# Patient Record
Sex: Male | Born: 1990 | Race: Black or African American | Hispanic: No | Marital: Single | Smoking: Current every day smoker
Health system: Southern US, Community
[De-identification: ages and names within clinical notes are randomized; demographics above are authoritative.]

## PROBLEM LIST (undated history)

## (undated) HISTORY — PX: SPINAL FUSION: SHX223

---
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
## 2011-04-12 ENCOUNTER — Ambulatory Visit
Payer: BC Managed Care – PPO | Attending: Physical Medicine and Rehabilitation | Admitting: Rehabilitative and Restorative Service Providers"

## 2011-04-12 DIAGNOSIS — M6281 Muscle weakness (generalized): Secondary | ICD-10-CM | POA: Insufficient documentation

## 2011-04-12 DIAGNOSIS — IMO0001 Reserved for inherently not codable concepts without codable children: Secondary | ICD-10-CM | POA: Insufficient documentation

## 2011-04-12 DIAGNOSIS — M25569 Pain in unspecified knee: Secondary | ICD-10-CM | POA: Insufficient documentation

## 2011-04-22 ENCOUNTER — Ambulatory Visit
Payer: BC Managed Care – PPO | Attending: Physical Medicine and Rehabilitation | Admitting: Rehabilitative and Restorative Service Providers"

## 2011-04-22 DIAGNOSIS — M25569 Pain in unspecified knee: Secondary | ICD-10-CM | POA: Insufficient documentation

## 2011-04-22 DIAGNOSIS — M6281 Muscle weakness (generalized): Secondary | ICD-10-CM | POA: Insufficient documentation

## 2011-04-22 DIAGNOSIS — IMO0001 Reserved for inherently not codable concepts without codable children: Secondary | ICD-10-CM | POA: Insufficient documentation

## 2011-04-24 ENCOUNTER — Ambulatory Visit: Payer: BC Managed Care – PPO | Admitting: Rehabilitative and Restorative Service Providers"

## 2011-04-29 ENCOUNTER — Ambulatory Visit: Payer: BC Managed Care – PPO | Admitting: Rehabilitative and Restorative Service Providers"

## 2011-05-02 ENCOUNTER — Ambulatory Visit: Payer: BC Managed Care – PPO | Admitting: Rehabilitative and Restorative Service Providers"

## 2011-05-06 ENCOUNTER — Ambulatory Visit: Payer: BC Managed Care – PPO | Admitting: Rehabilitative and Restorative Service Providers"

## 2011-05-08 ENCOUNTER — Encounter: Payer: BC Managed Care – PPO | Admitting: Rehabilitative and Restorative Service Providers"

## 2011-05-17 ENCOUNTER — Ambulatory Visit: Payer: BC Managed Care – PPO | Admitting: Physical Therapy

## 2011-05-20 ENCOUNTER — Encounter: Payer: BC Managed Care – PPO | Admitting: Rehabilitative and Restorative Service Providers"

## 2011-05-21 ENCOUNTER — Ambulatory Visit
Payer: BC Managed Care – PPO | Attending: Physical Medicine and Rehabilitation | Admitting: Rehabilitative and Restorative Service Providers"

## 2011-05-21 DIAGNOSIS — M6281 Muscle weakness (generalized): Secondary | ICD-10-CM | POA: Insufficient documentation

## 2011-05-21 DIAGNOSIS — IMO0001 Reserved for inherently not codable concepts without codable children: Secondary | ICD-10-CM | POA: Insufficient documentation

## 2011-05-21 DIAGNOSIS — M25569 Pain in unspecified knee: Secondary | ICD-10-CM | POA: Insufficient documentation

## 2011-05-24 ENCOUNTER — Encounter: Payer: BC Managed Care – PPO | Admitting: Rehabilitative and Restorative Service Providers"

## 2011-05-27 ENCOUNTER — Encounter: Payer: BC Managed Care – PPO | Admitting: Rehabilitative and Restorative Service Providers"

## 2011-05-29 ENCOUNTER — Ambulatory Visit: Payer: BC Managed Care – PPO | Admitting: Rehabilitative and Restorative Service Providers"

## 2011-06-03 ENCOUNTER — Encounter: Payer: BC Managed Care – PPO | Admitting: Rehabilitative and Restorative Service Providers"

## 2011-06-05 ENCOUNTER — Encounter: Payer: BC Managed Care – PPO | Admitting: Rehabilitative and Restorative Service Providers"

## 2011-06-12 ENCOUNTER — Ambulatory Visit: Payer: BC Managed Care – PPO | Admitting: Rehabilitative and Restorative Service Providers"

## 2011-06-14 ENCOUNTER — Encounter: Payer: BC Managed Care – PPO | Admitting: Rehabilitative and Restorative Service Providers"

## 2016-04-18 ENCOUNTER — Emergency Department: Payer: No Typology Code available for payment source

## 2016-04-18 ENCOUNTER — Emergency Department
Admission: EM | Admit: 2016-04-18 | Discharge: 2016-04-18 | Disposition: A | Discharge status: Home / Self Care | Payer: No Typology Code available for payment source | Attending: Emergency Medicine | Admitting: Emergency Medicine

## 2016-04-18 DIAGNOSIS — S161XXA Strain of muscle, fascia and tendon at neck level, initial encounter: Secondary | ICD-10-CM | POA: Insufficient documentation

## 2016-04-18 DIAGNOSIS — S199XXA Unspecified injury of neck, initial encounter: Secondary | ICD-10-CM | POA: Diagnosis present

## 2016-04-18 DIAGNOSIS — S7011XA Contusion of right thigh, initial encounter: Secondary | ICD-10-CM | POA: Insufficient documentation

## 2016-04-18 DIAGNOSIS — Y999 Unspecified external cause status: Secondary | ICD-10-CM | POA: Diagnosis not present

## 2016-04-18 DIAGNOSIS — S7001XA Contusion of right hip, initial encounter: Secondary | ICD-10-CM | POA: Diagnosis not present

## 2016-04-18 DIAGNOSIS — Y92410 Unspecified street and highway as the place of occurrence of the external cause: Secondary | ICD-10-CM | POA: Diagnosis not present

## 2016-04-18 DIAGNOSIS — Y9389 Activity, other specified: Secondary | ICD-10-CM | POA: Insufficient documentation

## 2016-04-18 MED ORDER — IBUPROFEN 800 MG PO TABS: 800.0000 mg | ORAL_TABLET | Freq: Three times a day (TID) | ORAL | Status: AC | PRN

## 2016-04-18 MED ORDER — CYCLOBENZAPRINE HCL 10 MG PO TABS: 10.0000 mg | ORAL_TABLET | Freq: Three times a day (TID) | ORAL | Status: AC | PRN
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
## 2016-04-18 NOTE — ED Notes (Signed)
Pt triaged on paper due to computer issues.

## 2016-04-18 NOTE — ED Notes (Signed)
See paper triage 
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
## 2016-12-29 ENCOUNTER — Emergency Department (HOSPITAL_COMMUNITY): Payer: Self-pay

## 2016-12-29 ENCOUNTER — Encounter (HOSPITAL_COMMUNITY): Payer: Self-pay

## 2016-12-29 ENCOUNTER — Emergency Department (HOSPITAL_COMMUNITY)
Admission: EM | Admit: 2016-12-29 | Discharge: 2016-12-29 | Disposition: A | Discharge status: Home / Self Care | Payer: Self-pay | Attending: Emergency Medicine | Admitting: Emergency Medicine

## 2016-12-29 DIAGNOSIS — W228XXA Striking against or struck by other objects, initial encounter: Secondary | ICD-10-CM | POA: Insufficient documentation

## 2016-12-29 DIAGNOSIS — K625 Hemorrhage of anus and rectum: Secondary | ICD-10-CM | POA: Insufficient documentation

## 2016-12-29 DIAGNOSIS — Z79899 Other long term (current) drug therapy: Secondary | ICD-10-CM | POA: Insufficient documentation

## 2016-12-29 DIAGNOSIS — T148XXA Other injury of unspecified body region, initial encounter: Secondary | ICD-10-CM

## 2016-12-29 DIAGNOSIS — R935 Abnormal findings on diagnostic imaging of other abdominal regions, including retroperitoneum: Secondary | ICD-10-CM | POA: Insufficient documentation

## 2016-12-29 DIAGNOSIS — S40812A Abrasion of left upper arm, initial encounter: Secondary | ICD-10-CM | POA: Insufficient documentation

## 2016-12-29 DIAGNOSIS — Y999 Unspecified external cause status: Secondary | ICD-10-CM | POA: Insufficient documentation

## 2016-12-29 DIAGNOSIS — Z23 Encounter for immunization: Secondary | ICD-10-CM | POA: Insufficient documentation

## 2016-12-29 DIAGNOSIS — Y939 Activity, unspecified: Secondary | ICD-10-CM | POA: Insufficient documentation

## 2016-12-29 DIAGNOSIS — R079 Chest pain, unspecified: Secondary | ICD-10-CM | POA: Insufficient documentation

## 2016-12-29 DIAGNOSIS — Y929 Unspecified place or not applicable: Secondary | ICD-10-CM | POA: Insufficient documentation

## 2016-12-29 DIAGNOSIS — F17200 Nicotine dependence, unspecified, uncomplicated: Secondary | ICD-10-CM | POA: Insufficient documentation

## 2016-12-29 DIAGNOSIS — R930 Abnormal findings on diagnostic imaging of skull and head, not elsewhere classified: Secondary | ICD-10-CM | POA: Insufficient documentation

## 2016-12-29 LAB — URINALYSIS, ROUTINE W REFLEX MICROSCOPIC
Bilirubin Urine: NEGATIVE
Glucose, UA: NEGATIVE mg/dL
Ketones, ur: 20 mg/dL — AB
Nitrite: NEGATIVE
PROTEIN: NEGATIVE mg/dL
Specific Gravity, Urine: 1.013 (ref 1.005–1.030)
pH: 5 (ref 5.0–8.0)

## 2016-12-29 LAB — COMPREHENSIVE METABOLIC PANEL
ALT: 27 U/L (ref 17–63)
AST: 33 U/L (ref 15–41)
Albumin: 4.9 g/dL (ref 3.5–5.0)
Alkaline Phosphatase: 56 U/L (ref 38–126)
Anion gap: 16 — ABNORMAL HIGH (ref 5–15)
BUN: 14 mg/dL (ref 6–20)
CHLORIDE: 104 mmol/L (ref 101–111)
CO2: 21 mmol/L — AB (ref 22–32)
CREATININE: 1.39 mg/dL — AB (ref 0.61–1.24)
Calcium: 9.1 mg/dL (ref 8.9–10.3)
GFR calc Af Amer: >60 mL/min (ref >60)
Glucose, Bld: 111 mg/dL — ABNORMAL HIGH (ref 65–99)
Potassium: 3.6 mmol/L (ref 3.5–5.1)
Sodium: 141 mmol/L (ref 135–145)
Total Bilirubin: 0.9 mg/dL (ref 0.3–1.2)
Total Protein: 8.2 g/dL — ABNORMAL HIGH (ref 6.5–8.1)

## 2016-12-29 LAB — I-STAT CHEM 8, ED
BUN: 13 mg/dL (ref 6–20)
CHLORIDE: 109 mmol/L (ref 101–111)
CREATININE: 1.4 mg/dL — AB (ref 0.61–1.24)
Calcium, Ion: 1.04 mmol/L — ABNORMAL LOW (ref 1.15–1.40)
Glucose, Bld: 105 mg/dL — ABNORMAL HIGH (ref 65–99)
HEMATOCRIT: 47 % (ref 39.0–52.0)
HEMOGLOBIN: 16 g/dL (ref 13.0–17.0)
POTASSIUM: 3.8 mmol/L (ref 3.5–5.1)
Sodium: 144 mmol/L (ref 135–145)
TCO2: 19 mmol/L (ref 0–100)

## 2016-12-29 LAB — CBC
HCT: 46.9 % (ref 39.0–52.0)
Hemoglobin: 16.3 g/dL (ref 13.0–17.0)
MCH: 31.4 pg (ref 26.0–34.0)
MCHC: 34.8 g/dL (ref 30.0–36.0)
MCV: 90.4 fL (ref 78.0–100.0)
PLATELETS: 351 10*3/uL (ref 150–400)
RBC: 5.19 MIL/uL (ref 4.22–5.81)
RDW: 13.4 % (ref 11.5–15.5)
WBC: 22 10*3/uL — AB (ref 4.0–10.5)

## 2016-12-29 LAB — POC OCCULT BLOOD, ED: Fecal Occult Bld: POSITIVE — AB

## 2016-12-29 LAB — I-STAT CG4 LACTIC ACID, ED: LACTIC ACID, VENOUS: 3.65 mmol/L — AB (ref 0.5–1.9)

## 2016-12-29 LAB — SAMPLE TO BLOOD BANK

## 2016-12-29 LAB — PROTIME-INR
INR: 0.95
PROTHROMBIN TIME: 12.6 s (ref 11.4–15.2)

## 2016-12-29 LAB — ETHANOL: Alcohol, Ethyl (B): 241 mg/dL — ABNORMAL HIGH (ref <5)

## 2016-12-29 MED ORDER — SODIUM CHLORIDE 0.9 % IV BOLUS (SEPSIS)
1000.0000 mL | Freq: Once | INTRAVENOUS | Status: AC
  Administered 2016-12-29: 1000 mL via INTRAVENOUS

## 2016-12-29 MED ORDER — CEFAZOLIN IN D5W 1 GM/50ML IV SOLN
1.0000 g | Freq: Once | INTRAVENOUS | Status: AC
  Administered 2016-12-29: 1 g via INTRAVENOUS
  Filled 2016-12-29: qty 50

## 2016-12-29 MED ORDER — SODIUM CHLORIDE 0.9 % IV BOLUS (SEPSIS)
2000.0000 mL | Freq: Once | INTRAVENOUS | Status: AC
  Administered 2016-12-29: 2000 mL via INTRAVENOUS

## 2016-12-29 MED ORDER — IOPAMIDOL (ISOVUE-300) INJECTION 61%
100.0000 mL | Freq: Once | INTRAVENOUS | Status: AC | PRN
  Administered 2016-12-29: 100 mL via INTRAVENOUS

## 2016-12-29 MED ORDER — IOPAMIDOL (ISOVUE-300) INJECTION 61%
INTRAVENOUS | Status: AC
  Filled 2016-12-29: qty 100

## 2016-12-29 MED ORDER — KETOROLAC TROMETHAMINE 30 MG/ML IJ SOLN
30.0000 mg | Freq: Once | INTRAMUSCULAR | Status: AC
  Administered 2016-12-29: 30 mg via INTRAVENOUS
  Filled 2016-12-29: qty 1

## 2016-12-29 MED ORDER — TETANUS-DIPHTH-ACELL PERTUSSIS 5-2.5-18.5 LF-MCG/0.5 IM SUSP
0.5000 mL | Freq: Once | INTRAMUSCULAR | Status: AC
  Administered 2016-12-29: 0.5 mL via INTRAMUSCULAR
  Filled 2016-12-29: qty 0.5

## 2016-12-29 MED ORDER — OMEPRAZOLE 20 MG PO CPDR: 20.0000 mg | DELAYED_RELEASE_CAPSULE | Freq: Every day | ORAL | 0 refills | Status: AC

## 2016-12-29 MED ORDER — MELOXICAM 7.5 MG PO TABS: 7.5000 mg | ORAL_TABLET | Freq: Every day | ORAL | 0 refills | Status: AC

## 2016-12-29 NOTE — ED Notes (Signed)
Shown labs to Dr Palumbo. 
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
## 2017-08-28 IMAGING — CT CT CERVICAL SPINE W/O CM
4 of 8 series · 13 of 33 positions shown, 14 images · non-contrast
Comparison: Radiograph 04/18/2016

CLINICAL DATA: Trauma

EXAM:
CT HEAD WITHOUT CONTRAST
CT CERVICAL SPINE WITHOUT CONTRAST
TECHNIQUE: Multidetector CT imaging of the head and cervical spine was
performed following the standard protocol without intravenous
contrast. Multiplanar CT image reconstructions of the cervical spine
were also generated.

[Series 6: c-spine st · axial · 0.23mm/px · z∈[-294,-202]mm · 3 of 93 slices shown, 4 images]
[im 24/93  soft-tissue]
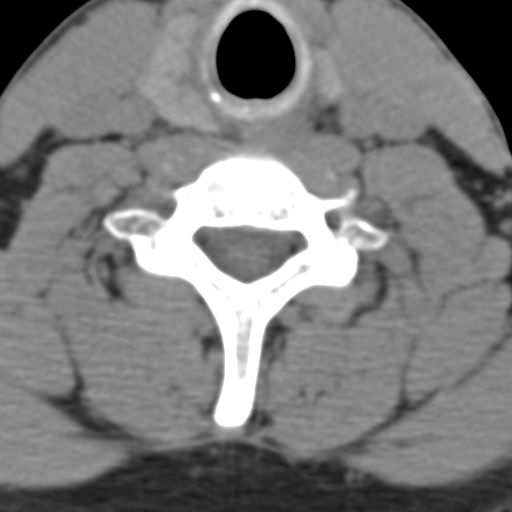
[im 24/93  bone]
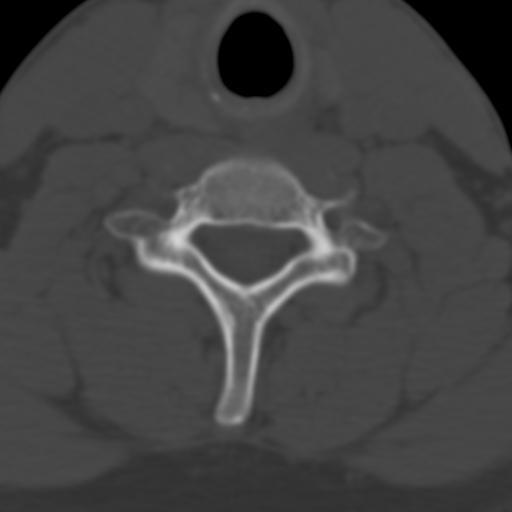
[im 47/93  bone]
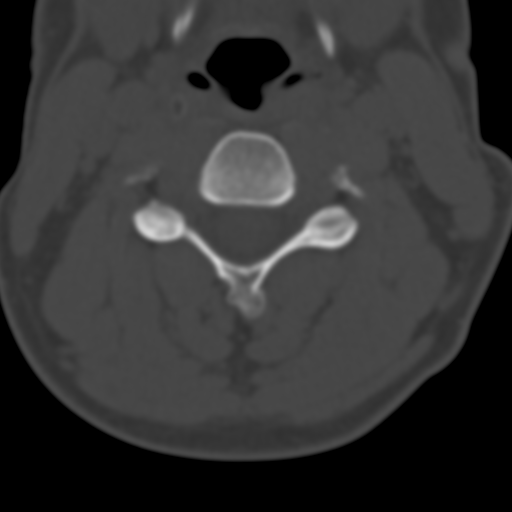
[im 70/93  bone]
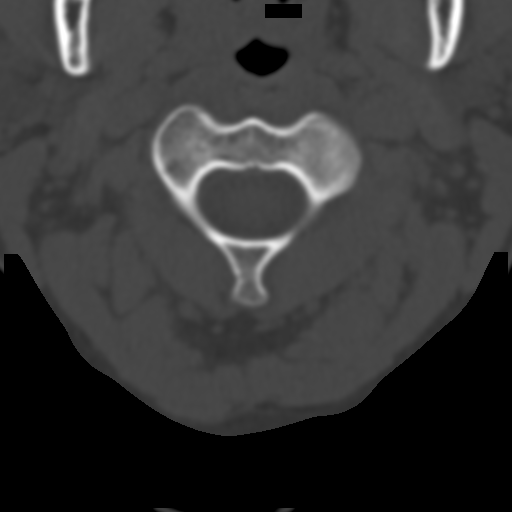

[Series 9: coronal · coronal · 0.30mm/px · 2 of 68 slices shown]
[im 23/68  bone]
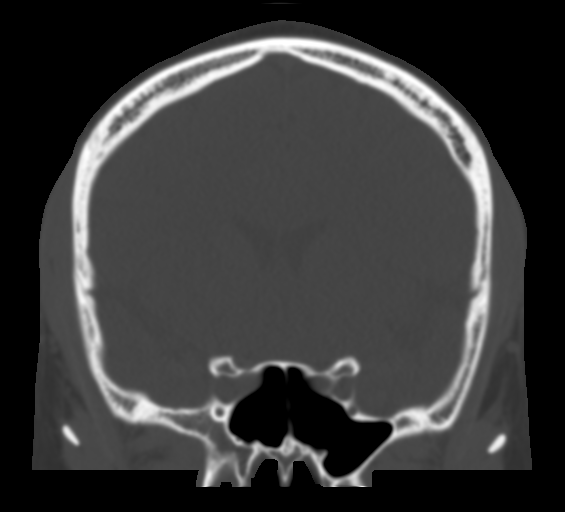
[im 45/68  bone]
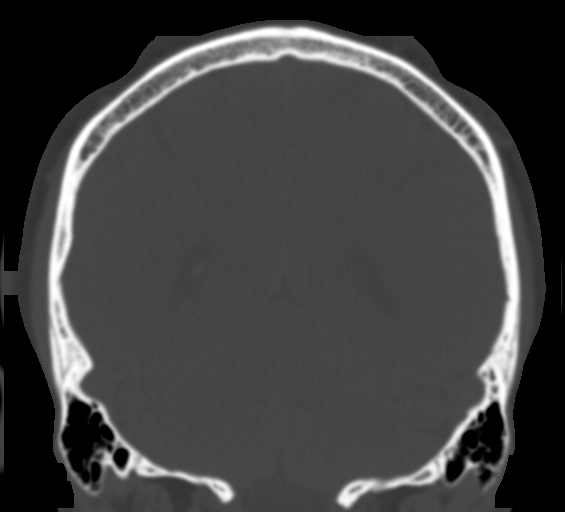

[Series 10: sagittal · sagittal · 0.30mm/px · 5 of 54 slices shown]
[im 8/54  bone]
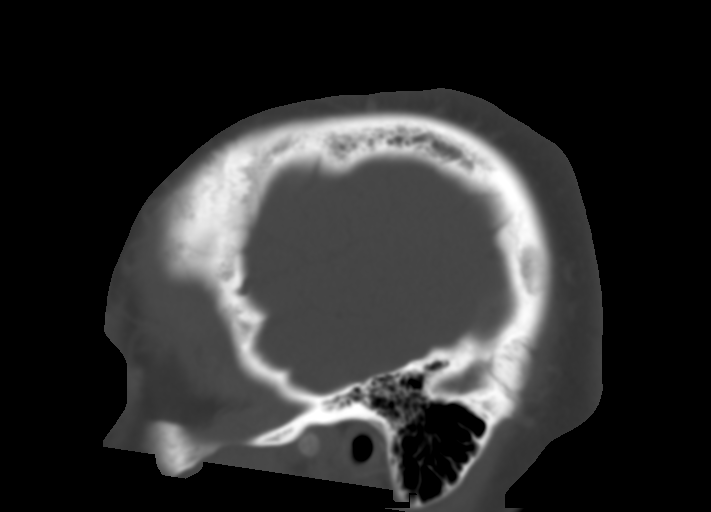
[im 16/54  bone]
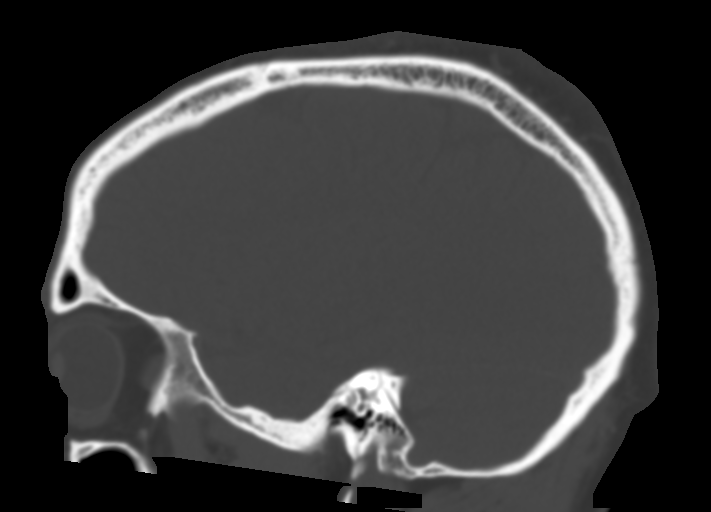
[im 23/54  bone]
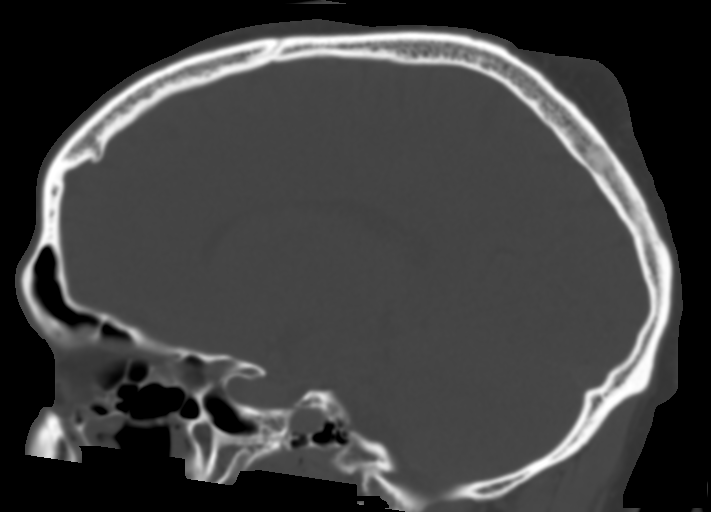
[im 31/54  bone]
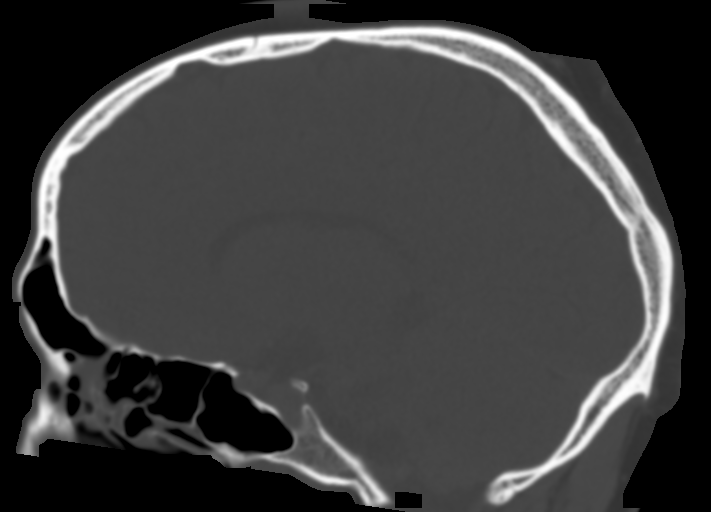
[im 38/54  bone]
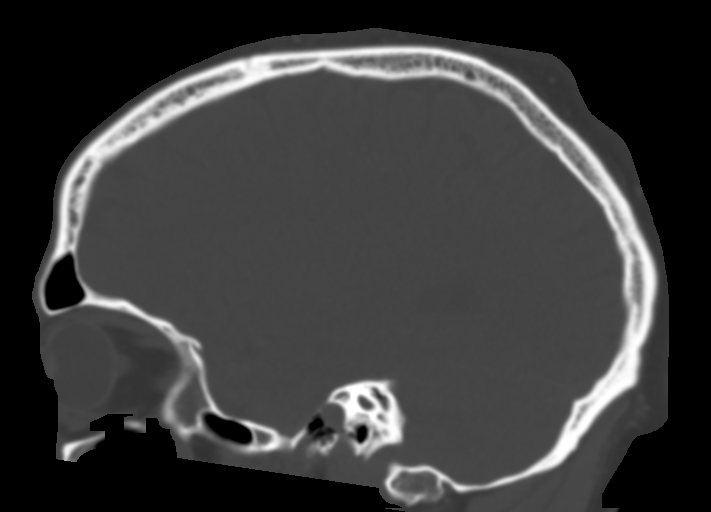

[Series 11: axial recon · axial · 0.22mm/px · z∈[-325,-226]mm · 3 of 105 slices shown]
[im 27/105  bone]
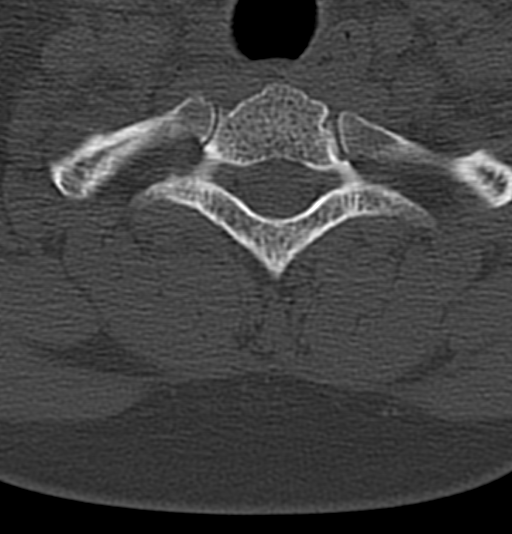
[im 53/105  bone]
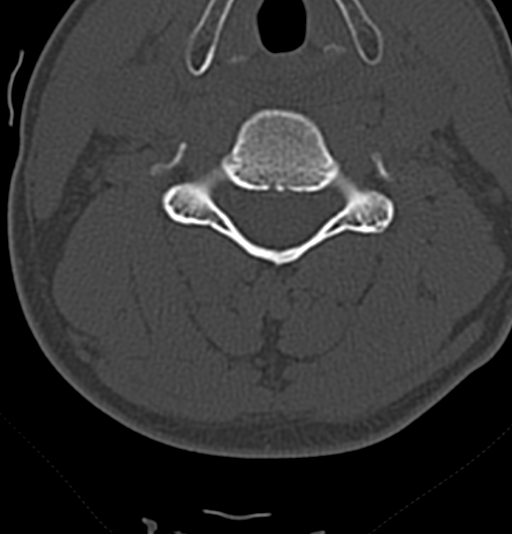
[im 79/105  bone]
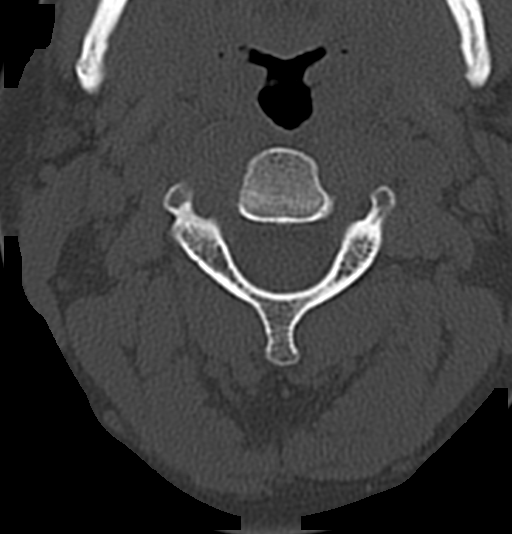

[13 of 33 positions shown; findings below may reference images not displayed]

FINDINGS: CT HEAD FINDINGS

Brain: No evidence of acute infarction, hemorrhage, hydrocephalus,
extra-axial collection or mass lesion/mass effect.

Vascular: No hyperdense vessel or unexpected calcification.

Skull: No depressed skull fracture.  Mastoid air cells are clear.

Sinuses/Orbits: Mucosal thickening in the maxillary and ethmoid
sinuses. No acute orbital abnormality.

Other: None

CT CERVICAL SPINE FINDINGS

Alignment: Mild straightening. No subluxation. Facet alignment
within normal limits.

Skull base and vertebrae: No acute fracture. No primary bone lesion
or focal pathologic process.

Soft tissues and spinal canal: No prevertebral fluid or swelling. No
visible canal hematoma.

Disc levels:  No significant disc disease

Upper chest: Negative.

Other: None
IMPRESSION: 1. No CT evidence for acute intracranial abnormality.
2. Mild straightening of the cervical spine. No acute fracture or
malalignment.
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
